# Patient Record
Sex: Female | Born: 1950 | Race: White | Hispanic: No | Marital: Single | Smoking: Former smoker
Health system: Southern US, Community
[De-identification: ages and names within clinical notes are randomized; demographics above are authoritative.]

## PROBLEM LIST (undated history)

## (undated) DIAGNOSIS — I10 Essential (primary) hypertension: Secondary | ICD-10-CM

## (undated) DIAGNOSIS — K219 Gastro-esophageal reflux disease without esophagitis: Secondary | ICD-10-CM

## (undated) DIAGNOSIS — N2581 Secondary hyperparathyroidism of renal origin: Secondary | ICD-10-CM

## (undated) DIAGNOSIS — N183 Chronic kidney disease, stage 3 unspecified: Secondary | ICD-10-CM

## (undated) DIAGNOSIS — I129 Hypertensive chronic kidney disease with stage 1 through stage 4 chronic kidney disease, or unspecified chronic kidney disease: Secondary | ICD-10-CM

## (undated) DIAGNOSIS — C7961 Secondary malignant neoplasm of right ovary: Secondary | ICD-10-CM

## (undated) DIAGNOSIS — C801 Malignant (primary) neoplasm, unspecified: Secondary | ICD-10-CM

## (undated) HISTORY — DX: Secondary malignant neoplasm of right ovary: C79.61

## (undated) HISTORY — DX: Chronic kidney disease, stage 3 unspecified: N18.30

## (undated) HISTORY — DX: Secondary malignant neoplasm of right ovary: C80.1

## (undated) HISTORY — PX: ILEOSTOMY: SHX1783

## (undated) HISTORY — DX: Essential (primary) hypertension: I10

## (undated) HISTORY — DX: Gastro-esophageal reflux disease without esophagitis: K21.9

## (undated) HISTORY — PX: LAPAROSCOPIC LYSIS OF ADHESIONS: SHX5905

## (undated) HISTORY — DX: Hypertensive chronic kidney disease with stage 1 through stage 4 chronic kidney disease, or unspecified chronic kidney disease: I12.9

## (undated) HISTORY — DX: Secondary hyperparathyroidism of renal origin: N25.81

## (undated) HISTORY — DX: Chronic kidney disease, stage 3 (moderate): N18.3

---

## 2012-04-19 ENCOUNTER — Inpatient Hospital Stay: Payer: Self-pay | Admitting: Surgery

## 2012-04-19 LAB — URINALYSIS, COMPLETE
Bilirubin,UR: NEGATIVE
Glucose,UR: NEGATIVE mg/dL (ref 0–75)
Leukocyte Esterase: NEGATIVE
Nitrite: NEGATIVE
Ph: 5 (ref 4.5–8.0)
Protein: 100

## 2012-04-19 LAB — COMPREHENSIVE METABOLIC PANEL
Albumin: 2.5 g/dL — ABNORMAL LOW (ref 3.4–5.0)
Alkaline Phosphatase: 148 U/L — ABNORMAL HIGH (ref 50–136)
Anion Gap: 12 (ref 7–16)
BUN: 19 mg/dL — ABNORMAL HIGH (ref 7–18)
Bilirubin,Total: 0.2 mg/dL (ref 0.2–1.0)
Calcium, Total: 8.6 mg/dL (ref 8.5–10.1)
Co2: 23 mmol/L (ref 21–32)
Creatinine: 1.36 mg/dL — ABNORMAL HIGH (ref 0.60–1.30)
EGFR (African American): 48 — ABNORMAL LOW
EGFR (Non-African Amer.): 42 — ABNORMAL LOW
Glucose: 120 mg/dL — ABNORMAL HIGH (ref 65–99)
SGPT (ALT): 11 U/L — ABNORMAL LOW (ref 12–78)
Sodium: 136 mmol/L (ref 136–145)
Total Protein: 6.8 g/dL (ref 6.4–8.2)

## 2012-04-19 LAB — CBC
HGB: 9.2 g/dL — ABNORMAL LOW (ref 12.0–16.0)
MCH: 27.8 pg (ref 26.0–34.0)
MCV: 85 fL (ref 80–100)
Platelet: 391 10*3/uL (ref 150–440)
RBC: 3.3 10*6/uL — ABNORMAL LOW (ref 3.80–5.20)
RDW: 14 % (ref 11.5–14.5)
WBC: 11.7 10*3/uL — ABNORMAL HIGH (ref 3.6–11.0)

## 2012-04-19 LAB — LIPASE, BLOOD: Lipase: 60 U/L — ABNORMAL LOW (ref 73–393)

## 2012-04-19 LAB — TROPONIN I: Troponin-I: <0.02 ng/mL

## 2012-04-20 LAB — BASIC METABOLIC PANEL
Creatinine: 1.12 mg/dL (ref 0.60–1.30)
EGFR (African American): >60
EGFR (Non-African Amer.): 53 — ABNORMAL LOW
Osmolality: 266 (ref 275–301)
Potassium: 3.9 mmol/L (ref 3.5–5.1)
Sodium: 132 mmol/L — ABNORMAL LOW (ref 136–145)

## 2012-04-21 HISTORY — PX: ABDOMINAL HYSTERECTOMY: SHX81

## 2012-04-21 LAB — CEA: CEA: 2.7 ng/mL (ref 0.0–4.7)

## 2012-04-21 LAB — CA 125: CA 125: 199.8 U/mL — ABNORMAL HIGH (ref 0.0–34.0)

## 2012-05-19 DEATH — deceased

## 2012-10-01 DIAGNOSIS — D649 Anemia, unspecified: Secondary | ICD-10-CM | POA: Insufficient documentation

## 2012-12-21 HISTORY — PX: EXPLORATORY LAPAROTOMY: SUR591

## 2012-12-24 DIAGNOSIS — K56609 Unspecified intestinal obstruction, unspecified as to partial versus complete obstruction: Secondary | ICD-10-CM | POA: Insufficient documentation

## 2014-08-08 NOTE — Consult Note (Signed)
PATIENT NAME:  Rachel Calderon, Rachel Calderon MR#:  [id number] DATE OF BIRTH:  01-Jul-1950  DATE OF CONSULTATION:  04/20/2012  REFERRING PHYSICIAN:  Sherri Rad, MD  CONSULTING PHYSICIAN:  Janalyn Harder. Jerelene Redden, ANP PRIMARY CARE PHYSICIAN: None listed.   REASON FOR CONSULTATION: To consider flexible sigmoidoscopy as part of a workup for a large pelvic mass finding.   HISTORY OF PRESENT ILLNESS: This patient reports that she has had constipation, started abruptly about five weeks ago. She says she has passed no stool in five weeks despite taking over-the-counter Ex-Lax and Dulcolax. She has been passing clear to slightly bloody discharge. She had been eating normally until Tuesday when she developed nausea and vomiting. Her stomach has been increasing in size. She does admit to a 28-pound weight loss over the last nine months to a year. She initially thought this was related to stress. 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 She did have unbearable lower abdominal pain and that was the reason she came into the Emergency Room.  A CT study found a large heterogeneous mass extending superiorly from the pelvis. Difficult to determine the origin of the mass because of its large size and the lack of IV contrast in the study. It appears to involve the uterus and appears inseparable from the sigmoid colon. There was marked associated wall thickening of the sigmoid colon at this site and invasion into or extension from the colon cannot be excluded. Additionally, the mass is inseparable from several loops of small bowel superiorly. A small foci of air along the superior aspect of the mass felt to be secondary to air within the adjacent decompressed loops of small bowel. A fistulous connection within the small bowel would be difficult to exclude. There was also circumferential wall thickening of the distal esophagus which may be secondary to underdistention or esophagitis but is nonspecific.  Malignancy is not excluded. Further evaluation could be provided with endoscopy.   PAST MEDICAL HISTORY: Denied.   PAST SURGICAL HISTORY: C-section x 1.   MEDICATIONS: None.   ALLERGIES: NKDA.  HABITS: Positive tobacco, about 1 pack per week, started at age 67. Negative alcohol or illicit drug use.   SOCIAL HISTORY: The patient says she gave up her baby for adoption. She lives alone. Two sisters present with her. 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 Sister pulls me aside and wants me to note that the patient has a low IQ and that she has difficulty understanding information. Sister reports this patient functions as a 64 year old. She said there is no formal diagnosis of being mentally handicapped, but this knowledge known to the family members.   REVIEW OF SYSTEMS: Positive weight loss, says she had a low-grade fever a couple of days ago, abdominal pain, nausea, vomiting this week. No hematemesis. She has passed some reddish discharge. Constipation was acute without any specific trigger. She denies any headache, vision change, hearing change, difficulty swallowing, odynophagia, heartburn, or reflux. No history of EGD. No prior history of colonoscopy. Says her bowels were normal until about five weeks ago. She denies bone pain, joint pain, edema, skin changes. Remaining 10 systems negative.   PHYSICAL EXAMINATION:  VITAL SIGNS: Temperature 98.4 with 98.7 T-max, pulse of 78, respirations 18, 114/69, pulse ox room air is 92%.  GENERAL: Small frame, thin Caucasian female resting in bed, NAD.  HEENT: Conjunctivae a little injected. Sclerae anicteric. Oral mucosa is dry and intact.  NECK: Supple. Trachea midline. No lymphadenopathy.  CARDIOVASCULAR: Heart tones S1, S2 without murmur, rub, or gallop.  LUNGS: CTA. Respirations are normal. ABDOMEN: Soft, there is distention, and a positive abdominal mass. There is firmness in the lower abdomen. Positive  tenderness left lower quadrant and central lower abdomen.  RECTUM: DRE deferred as it was negative per Dr. Felton Clinton exam.  EXTREMITIES: Lower extremities without edema, cyanosis, or clubbing.  SKIN: Warm and dry without rash. NEUROLOGIC: Cranial nerves grossly intact.  PSYCHIATRIC: Affect and mood a little blunted, she is alert and oriented, interacting, alert and oriented to person, place, and time. A reasonable historian. Speech is clear.   LABORATORY: Admission blood work with glucose 120, BUN 19, creatinine 1.36, sodium 136, potassium 3.1, lipase is 60, albumin 2.5, alkaline phosphatase 148, AST 19, ALT 11. Troponin less than 0.02. WBC 11.7, hemoglobin 9.2, platelet count 391, MCV 85.   Urinalysis: Amber, cloudy, positive ketones, positive for protein, negative nitrite, esterase. Six WBCs per high-powered field, trace bacteria, non-epithelial cells, and positive mucus and amorphous crystal present.   RADIOLOGY: CT scan with mild basilar opacities secondary to atelectasis. Small hiatus hernia. Mild wall thickening distal esophagus at the gastroesophageal junction. Large mass superiorly from the pelvis 12.1 x 8.9 cm in greatest dimension. Mild stranding surrounding the sigmoid and the rectum. This mass was inseparable from several loops of adjacent small bowel. There is a small foci of air along the superior aspect of the mass which are favored to represent air within the compressed loops of the adjacent bowel. However, fistulous connection with the bowel will be difficult to exclude. There is bowel wall thickening of the sigmoid colon in the region that is inseparable from the pelvic mass. Mild stranding surrounding the sigmoid colon and rectum which is nonspecific. Appendix is normal. There are few borderline enlarged retroperitoneal lymph nodes which are nonspecific.   Pelvic ultrasound with transvaginal performed 04/20/2012 shows a large uterine mass with mild right hydronephrosis that could be  secondary to a large pelvic mass.   IMPRESSION:  49. A 64 year old female with large pelvic mass appears uterine in nature. She does report a 28-pound weight loss over the last 9 months, acute constipation started five weeks ago that has progressed to abdominal pain, nausea, vomiting. 2. Right hydroureter noted, elevated BUN and creatinine, low albumin, elevated alkaline phosphatase. Tumor markers are pending.  3. CT scan is abnormal with a large mass extending superiorly from the pelvis, appears to involve the uterus, and is inseparable from the sigmoid colon. There is marked wall thickening of the sigmoid colon and the question is if there is invasion into or extension from a colon.  4. Abnormal distal esophageal  thickening on CT.    PLAN: Dr. Vira Agar discussed this case with Dr. Marina Gravel.  Possible luminal evaluation in the form flexible sigmoidoscopy. Not certain if patient is able to drink oral laxatives for a full bowel preparation for full colonoscopy. There is finding on the CT of distal esophageal thickening which could be due to underdistention or esophagitis, but is nonspecific and malignancy also needs to be ruled out.  Dr. Vira Agar will decide on plans for luminal evaluation.   This case was discussed in collaboration of care with Dr. Vira Agar. Further recommendations pending. Thank you for this consultation.  These services provided by Joelene Millin A. Jerelene Redden, ANP under collaborative agreement with Dr. Vira Agar.    ____________________________ Janalyn Harder. Jerelene Redden, ANP kam:es D: 04/20/2012 12:41:20 ET T: 04/20/2012 13:07:19 ET JOB#: [id number]  cc: Joelene Millin A. Jerelene Redden, ANP, <Dictator> Janalyn Harder. Sherlyn Hay, MSN, ANP-BC Adult Nurse Practitioner ELECTRONICALLY SIGNED 04/20/2012 14:52

## 2014-08-08 NOTE — H&P (Signed)
Subjective/Chief Complaint severe constipation    History of Present Illness 64 y/o female without past medical history presents to ER with about a month of severe constipation along with intermittent blood in stools.  Progressive weight loss and mild lower abdominal pain.  Also with recent nausea and emesis.  Denies Vaginal bleeding.    Past History none    Primary Physician none   Past Med/Surgical Hx:  c-section:   ALLERGIES:  No Known Allergies:   Family and Social History:   Family History Non-Contributory    Social History positive tobacco (Greater than 1 year), negative ETOH, negative Illicit drugs    Place of Living Home   Review of Systems:   Subjective/Chief Complaint see above    Abdominal Pain Yes    Diarrhea No    Nausea/Vomiting Yes   Physical Exam:   GEN cachectic, thin    HEENT pale conjunctivae, PERRL, hearing intact to voice    NECK supple    RESP normal resp effort  clear BS    CARD regular rate  no murmur    ABD positive tenderness  no hernia  soft  adominal Mass  large lower abdominal mass present.    LYMPH negative neck    EXTR negative cyanosis/clubbing    SKIN normal to palpation    NEURO cranial nerves intact    PSYCH A+O to time, place, person    Additional Comments rectal exam shows smooth mass anterior to rectum, no obvious rectal mass on DRE.   Lab Results: Hepatic:  02-Jan-14 12:18    Bilirubin, Total 0.2   Alkaline Phosphatase  148   SGPT (ALT)  11   SGOT (AST) 19   Total Protein, Serum 6.8   Albumin, Serum  2.5  Routine Chem:  02-Jan-14 12:18    Lipase  60 (Result(s) reported on 19 Apr 2012 at 01:16PM.)   Glucose, Serum  120   BUN  19   Creatinine (comp)  1.36   Sodium, Serum 136   Potassium, Serum  3.1   Chloride, Serum 101   CO2, Serum 23   Calcium (Total), Serum 8.6   Osmolality (calc) 275   eGFR (African American)  48   eGFR (Non-African American)  42 (eGFR values <37m/min/1.73 m2 may be an  indication of chronic kidney disease (CKD). Calculated eGFR is useful in patients with stable renal function. The eGFR calculation will not be reliable in acutely ill patients when serum creatinine is changing rapidly. It is not useful in  patients on dialysis. The eGFR calculation may not be applicable to patients at the low and high extremes of body sizes, pregnant women, and vegetarians.)   Anion Gap 12  Cardiac:  02-Jan-14 12:18    Troponin I < 0.02 (0.00-0.05 0.05 ng/mL or less: NEGATIVE  Repeat testing in 3-6 hrs  if clinically indicated. >0.05 ng/mL: POTENTIAL  MYOCARDIAL INJURY. Repeat  testing in 3-6 hrs if  clinically indicated. NOTE: An increase or decrease  of 30% or more on serial  testing suggests a  clinically important change)  Routine UA:  02-Jan-14 12:18    Color (UA) Amber   Clarity (UA) Cloudy   Glucose (UA) Negative   Bilirubin (UA) Negative   Ketones (UA) 1+   Specific Gravity (UA) 1.029   Blood (UA) 2+   pH (UA) 5.0   Protein (UA) 100 mg/dL   Nitrite (UA) Negative   Leukocyte Esterase (UA) Negative (Result(s) reported on 19 Apr 2012 at  01:21PM.)   RBC (UA) 4 /HPF   WBC (UA) 6 /HPF   Bacteria (UA) TRACE   Epithelial Cells (UA) 9 /HPF   Mucous (UA) PRESENT   Amorphous Crystal (UA) PRESENT (Result(s) reported on 19 Apr 2012 at 01:21PM.)  Routine Hem:  02-Jan-14 12:18    WBC (CBC)  11.7   RBC (CBC)  3.30   Hemoglobin (CBC)  9.2   Hematocrit (CBC)  28.0   Platelet Count (CBC) 391 (Result(s) reported on 19 Apr 2012 at 01:08PM.)   MCV 85   MCH 27.8   MCHC 32.7   RDW 14.0   Radiology Results: LabUnknown:    02-Jan-14 15:18, CT Abdomen and Pelvis Without Contrast   PACS Image  CT:   CT Abdomen and Pelvis Without Contrast   REASON FOR EXAM:    (1) abd pain, abdominal mass; (2) abd pain, abdominal   mass  COMMENTS:       PROCEDURE: CT  - CT ABDOMEN AND PELVIS W0  - Apr 19 2012  3:18PM     RESULT:  Comparison: None    Technique: Multiple  axial images from the lung bases to the symphysis   pubis were obtained with oral and without intravenous contrast.    Findings:  Mild basilar opacities are likely secondary to atelectasis. There is a   small hiatal hernia. There is mild wall thickening of the distal   esophagus at the gastroesophageal junction, which is nonspecific.  Lack of intravenous contrast limits evaluation of the solid abdominal   organs.  Grossly, the liver, spleen, adrenals, and pancreas are   unremarkable. Multiple gallstones are present within the gallbladder. The   left kidney is unremarkable. There is moderate right hydronephrosis and   ureterectasis. This is seen to the level of the mass in the pelvis.    There is a very large lobular mass extending superiorly from the pelvis.   It measures 12.1x 8.9 cm in greatest axial dimension. It is difficult to   evaluate secondary to lack of intravenous contrast, but it is inseparable   from the uterus as well as the sigmoid colon. It is also inseparable from   several loops of adjacent small bowel. There are small foci of air along   the superior aspect of the mass which are favored to represent air within   compressed loops of the adjacent bowel. However, a fistulous connection   with the bowel with be difficult to exclude. There is bowel wall   thickening of the sigmoid colon in the region that it is inseparable from     the pelvic mass. There is mild stranding surrounding the sigmoid colon   and rectum, which is nonspecific. This could related to invasion or   infectious or inflammatory changes. The appendix is normal.    No aggressive lytic or sclerotic osseous lesions are identified. Tiny   sclerotic density in the left pubic symphysis likely represents a bone   island.    IMPRESSION:    1. Large heterogeneous mass extending superiorly from the pelvis. It is   difficult to determine the origin of this mass in part related to its   large size and lack of  intravenous contrast. It is favored to originate   from the uterus. It appears to involve the uterus and appears inseparable   from the sigmoid colon. There is marked associated wall thickening of the   sigmoid colon at this site, and invasion into or extension from  the colon     cannot be excluded. Additionally, the mass is inseparable from several   loops of small bowel superiorly. Small foci of air along the superior   aspect of the mass are felt to be secondary to air within the adjacent   decompressed loops of small bowel. A fistulous connection with the small   amount would be difficult to exclude. Surgical consultation is   recommended.  2. There is circumferential wall thickening of the distal esophagus which   may be secondary to underdistention or esophagitis, but is nonspecific.   Malignancy is not excluded. Further evaluation could be provided with   endoscopy.  3. There is moderate right hydronephrosis which is likely related to   obstruction caused by the large pelvic mass.      Addendum:  There are a few borderline enlarged retroperitoneal lymph nodes, which   are nonspecific.        Verified By: Gregor Hams, M.D., MD     Assessment/Admission Diagnosis 64 y/o female with pelvic mass-  appears uterine in nature.  Recent weight lose, contipation. right hydroureter and elevated creatinine concerning. mild anemia    Plan admit, pain control. pelvic US in am CEA, CA 125, oncology consult. plan gyn onc evaluation as outpatient.   Electronic Signatures: Sherri Rad (MD)  (Signed 03-Jan-14 06:09)  Authored: CHIEF COMPLAINT and HISTORY, PAST MEDICAL/SURGIAL HISTORY, ALLERGIES, FAMILY AND SOCIAL HISTORY, REVIEW OF SYSTEMS, PHYSICAL EXAM, LABS, Radiology, ASSESSMENT AND PLAN   Last Updated: 03-Jan-14 06:09 by Sherri Rad (MD)

## 2014-08-08 NOTE — Consult Note (Signed)
Flex sig shows extrinsic complete obstruction of the proximal rectum/rectosigmoid area.  Findings related to Dr. Marina Gravel.  Electronic Signatures: Manya Silvas (MD)  (Signed on 03-Jan-14 16:54)  Authored  Last Updated: 03-Jan-14 16:54 by Manya Silvas (MD)

## 2014-08-08 NOTE — Discharge Summary (Signed)
PATIENT NAME:  Rachel Calderon, Rachel Calderon MR#:  [id number] DATE OF BIRTH:  [birth year]  DATE OF ADMISSION:  05/16/2012 DATE OF DISCHARGE:  04/20/2012  DISCHARGE DIAGNOSES:  1. Pelvic mass. likely uterine in origin. 2. Rectal obstruction.  3. Right hydronephrosis and hydroureter.   PRINCIPAL PROCEDURES:  1. Oral-contrasted abdominopelvic CT scan.  2. Pelvic ultrasound.  3. GI medicine consultation.  4. Flexible sigmoidoscopy.  HOSPITAL COURSE SUMMARY: The patient was admitted on the evening of 05/07/2012. See history and physical for further details. 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 CEA and CA-125 were drawn but have not returned. A pelvic ultrasound was obtained which demonstrated a large mass within the pelvis. The ovaries could not be identified. There was no evidence of ascites seen on CT scan, no obvious omental caking. Images of the liver appear normal, however, are limited by the lack of intravenous contrast. Her creatinine improved somewhat with hydration. The patient continues to have constipation. Despite oral laxatives, she really had no return of stool.  A flexible endoscopy showed rectal obstruction.   The patient's abdomen has been soft upon my most recent examination at the time of this dictation. The patient is nontoxic. After consulting with our local gynecological oncologist, who is not available until next week, I contacted [facility] regarding transfer. They have gladly and graciously accepted the patient in transfer. I discussed this plan with the patient and her family. They understand and wish to proceed with this course of action.   DISCHARGE MEDICATIONS: 1. Acetaminophen tablet 650 mg by mouth every 4 hours as needed.  2. Norco 5/325, 1 to 2 tabs p.o. every 4 hours as needed for pain. 3. Heparin 5000 units subcutaneous q.12 h.  4. Zofran 4 mg IV every 4 hours as needed.  5. Protonix 40 mg by mouth q. a.m.  6. Ambien 10 mg by  mouth as needed for sleep.  7. Morphine 2 to 4 mg every 4 hours as needed for pain.   ____________________________ Jeannette How. Marina Gravel, MD mab:cb D: 04/20/2012 17:43:57 ET T: 04/20/2012 17:52:44 ET JOB#: [id number]  cc: Elta Guadeloupe A. Marina Gravel, MD, <Dictator> Hortencia Conradi MD ELECTRONICALLY SIGNED 04/20/2012 18:30

## 2014-08-08 NOTE — Consult Note (Signed)
See complete consult note from NP.  Pt with abd mass, abnormal CT, possible uterine or colonic large mass, severe constipation, LLQ signif tenderness on exam.  Passing blood and fluid recently.  Plan for Fleets enema and a sigmoidoscopy with anesthesia providing sedation if available.  Pt never had a colonoscopy.  I discussed the higher possible perforation rate due to possibility of tumor eroding into the colon wall.  Electronic Signatures: Manya Silvas (MD)  (Signed on 03-Jan-14 12:46)  Authored  Last Updated: 03-Jan-14 12:46 by Manya Silvas (MD)

## 2014-11-06 ENCOUNTER — Encounter: Payer: Self-pay | Admitting: Family Medicine

## 2015-03-10 ENCOUNTER — Encounter: Payer: Self-pay | Admitting: Family Medicine

## 2015-03-10 ENCOUNTER — Ambulatory Visit (INDEPENDENT_AMBULATORY_CARE_PROVIDER_SITE_OTHER): Payer: Medicare Other | Admitting: Family Medicine

## 2015-03-10 ENCOUNTER — Other Ambulatory Visit: Payer: Self-pay | Admitting: Family Medicine

## 2015-03-10 VITALS — BP 172/96 | HR 116 | Temp 98.7°F | Resp 16 | Ht 62.0 in | Wt 129.8 lb

## 2015-03-10 DIAGNOSIS — IMO0001 Reserved for inherently not codable concepts without codable children: Secondary | ICD-10-CM | POA: Insufficient documentation

## 2015-03-10 DIAGNOSIS — C7960 Secondary malignant neoplasm of unspecified ovary: Secondary | ICD-10-CM

## 2015-03-10 DIAGNOSIS — I10 Essential (primary) hypertension: Secondary | ICD-10-CM | POA: Diagnosis not present

## 2015-03-10 DIAGNOSIS — N183 Chronic kidney disease, stage 3 unspecified: Secondary | ICD-10-CM

## 2015-03-10 DIAGNOSIS — Z8619 Personal history of other infectious and parasitic diseases: Secondary | ICD-10-CM

## 2015-03-10 DIAGNOSIS — N2581 Secondary hyperparathyroidism of renal origin: Secondary | ICD-10-CM

## 2015-03-10 DIAGNOSIS — K219 Gastro-esophageal reflux disease without esophagitis: Secondary | ICD-10-CM

## 2015-03-10 DIAGNOSIS — I129 Hypertensive chronic kidney disease with stage 1 through stage 4 chronic kidney disease, or unspecified chronic kidney disease: Secondary | ICD-10-CM | POA: Diagnosis not present

## 2015-03-10 DIAGNOSIS — R4183 Borderline intellectual functioning: Secondary | ICD-10-CM

## 2015-03-10 MED ORDER — METOPROLOL SUCCINATE ER 25 MG PO TB24: 25.0000 mg | ORAL_TABLET | Freq: Every day | ORAL | Status: DC

## 2015-03-10 NOTE — Progress Notes (Signed)
Name: Rachel Calderon   MRN: [id number]    DOB: [birth year]   Date:03/10/2015       Progress Note  Subjective  Chief Complaint  Chief Complaint  Patient presents with  . Hypertension  . Advice Only    review of CT images from [facility]    HPI  Rachel Calderon is a 64 year old female accompanied by her sister today to discuss her medical needs. She has not followed up with me in nearly a year, last seen when they established care with me 01/22/2014. Her history is significant for ovarian adenocarcinoma, CKD III, HTN, GERD, borderline mental hadicap, history of shingles.   Gyne-Onc is Dr. 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 S/P 6 rounds of Chemo (carboplatin/taxol) in 2014 with post treatment CT July 2014 negative. S/P colostomy reversal.  In regards to her oncology needs she has been working with [facility] closely due to repeat CT scan on 02/23/15 finding:  IMPRESSION:  -- New area of focal intrahepatic biliary dilation versus small branch venous thrombosis in the left hepatic lobe. Although a causative lesion is not identified in the liver on this study, attention on followup is recommended.  -- Slightly smaller porta hepatis lymph node with similar mild stranding/ill-defined soft tissue density in the porta hepatic and peri-pancreatic region. Stranding and fluid inferior to the spleen are slightly increased, which is nonspecific but could reflect slightly worsened carcinomatosis; attention on follow up.  -- Scattered 3-mm right upper lobe pulmonary nodules, overall similar. One nodule is questionably minimally more prominent; attention on follow up.  -- Cholelithiasis, unchanged.   She has started back on chemotherapy, Doxil treatment. After a few sessions she had blistering of hands and skin, placed on steroids, blisters resolved. However her HTN is out of control.  Ranging systolic 99991111 and diastolic [id number]. She denies chest pain, nausea, vomiting, headaches, dizziness, syncope, paresthesia.   Sister reports to me they have not followed up with Nephrologist regarding CKD III.    Past Medical History  Diagnosis Date  . Metastatic adenocarcinoma involving right ovary with unknown primary site [facility])   . Hypertension, goal below 150/90   . Hypertensive kidney disease with CKD stage III   . GERD without esophagitis   . Secondary hyperparathyroidism, renal [facility])     Patient Active Problem List   Diagnosis Date Noted  . Hypertension goal BP (blood pressure) < 140/90 03/10/2015  . GERD without esophagitis 03/10/2015  . Metastatic adenocarcinoma of ovary ([place]) 03/10/2015  . Borderline mental handicap 03/10/2015  . History of shingles 03/10/2015  . Hypertensive kidney disease with CKD stage III 03/10/2015  . Secondary hyperparathyroidism of renal origin ([place]) 03/10/2015  . Small bowel obstruction ([place]) 12/24/2012  . Absolute anemia 10/01/2012  . Encounter for antineoplastic chemotherapy 06/13/2012    Social History  Substance Use Topics  . Smoking status: Former Research scientist (life sciences)  . Smokeless tobacco: Not on file  . Alcohol Use: No     Current outpatient prescriptions:  .  CARAFATE 1 GM/10ML suspension, Take 5 mLs by mouth 2 (two) times daily., Disp: , Rfl: 0 .  dexamethasone (DECADRON) 4 MG tablet, Take 2 tablets by mouth on days 2, 3, and 4 after chemotherapy., Disp: , Rfl:  .  omeprazole (PRILOSEC) 20 MG capsule, Take 20 mg by mouth 2 (two) times daily., Disp: , Rfl: 0 .  loratadine (CLARITIN) 10 MG tablet, Take  10 mg by mouth daily., Disp: , Rfl:  .  metoprolol succinate (TOPROL-XL) 25 MG 24 hr tablet, Take 1 tablet (25 mg total) by mouth daily., Disp: 30 tablet, Rfl: 1 .  polyethylene glycol (MIRALAX / GLYCOLAX) packet, One packet daily, Disp: , Rfl:  .  prochlorperazine (COMPAZINE) 10 MG tablet, Take 10 mg by mouth., Disp: , Rfl:   Past  Surgical History  Procedure Laterality Date  . Exploratory laparotomy  12/21/2012    with celiotomy  . Ileostomy      reversal  . Laparoscopic lysis of adhesions      adnexal adhesions  . Abdominal hysterectomy  04/21/2012    total  . Cesarean section      Family History  Problem Relation Age of Onset  . Heart disease Mother   . COPD Mother   . Heart disease Father     No Known Allergies   Review of Systems  CONSTITUTIONAL: No significant weight changes, fever, chills. Stable weakness and fatigue.  HEENT:  - Eyes: No visual changes.  - Ears: No auditory changes. No pain.  - Nose: No sneezing, congestion, runny nose. - Throat: No sore throat. No changes in swallowing. SKIN: No rash or itching.  CARDIOVASCULAR: No chest pain, chest pressure or chest discomfort. No palpitations or edema.  RESPIRATORY: No shortness of breath, cough or sputum.  NEUROLOGICAL: No headache, dizziness, syncope, paralysis, ataxia, numbness or tingling in the extremities. No memory changes. No change in bowel or bladder control.  MUSCULOSKELETAL: No joint pain. No muscle pain. HEMATOLOGIC: No anemia, bleeding or bruising.  LYMPHATICS: No enlarged lymph nodes.  PSYCHIATRIC: No change in mood. No change in sleep pattern.  ENDOCRINOLOGIC: No reports of sweating, cold or heat intolerance. No polyuria or polydipsia.     Objective  BP 172/96 mmHg  Pulse 116  Temp(Src) 98.7 F (37.1 C) (Oral)  Resp 16  Ht 5\' 2"  (1.575 m)  Wt 129 lb 12.8 oz (58.877 kg)  BMI 23.73 kg/m2  SpO2 99% Body mass index is 23.73 kg/(m^2).  Physical Exam  Constitutional: Patient appears nourished, notable hair loss. In no distress. Cheeks swollen, soft swelling over left clavicle (non pulsatile). HEENT:  - Head: Normocephalic and atraumatic.  - Ears: Bilateral TMs gray, no erythema or effusion - Nose: Nasal mucosa moist - Mouth/Throat: Oropharynx is clear and moist. No tonsillar hypertrophy or erythema. No post nasal  drainage.  - Eyes: Conjunctivae clear, EOM movements normal. PERRLA. No scleral icterus.  Neck: Normal range of motion. Neck supple. No JVD present. No thyromegaly present.  Cardiovascular: Normal rate, regular rhythm and normal heart sounds.  No murmur heard.  Pulmonary/Chest: Effort normal and breath sounds normal. No respiratory distress. Musculoskeletal: Normal range of motion bilateral UE and LE, no joint effusions. Peripheral vascular: Bilateral LE no edema. Neurological: CN II-XII grossly intact with no focal deficits. Alert and oriented to person, place, and time. Coordination, balance, strength, speech and gait are normal.  Skin: Skin is warm and dry. No rash noted. No erythema.  Psychiatric: Patient has a stable mood and affect. Behavior is normal in office today.  Assessment & Plan  1. Hypertension goal BP (blood pressure) < 140/90 JNC-8 vs ADA treatment goals discussed with patient. Current blood pressure not well controled. Remains clinically asymptomatic. Plan to initiate BB as ACEi/ARB/Diuretic might  worsen CKD stage III. The patient has been counseled on the proper use, side effects and potential interactions of the new medication. Patient encouraged to review the  side effects and safety profile pamphlet provided with the prescription from the pharmacy as well as request counseling from the pharmacy team as needed.    - metoprolol succinate (TOPROL-XL) 25 MG 24 hr tablet; Take 1 tablet (25 mg total) by mouth daily.  Dispense: 30 tablet; Refill: 1  2. Metastatic adenocarcinoma of ovary, unspecified laterality [facility]) Reviewed available documentation and testing though Care Everywhere.  3. Hypertensive kidney disease with CKD stage III Staff will reach out to Nephrologist and secure follow up appointment for her.

## 2015-03-11 ENCOUNTER — Telehealth: Payer: Self-pay

## 2015-03-11 NOTE — Telephone Encounter (Signed)
Called patient and notified her we have scheduled her an appointment at [facility] on Tuesday 03-17-15 at 11:00am with Dr. Juleen China. Pt appreciates all of our help.

## 2015-03-16 ENCOUNTER — Ambulatory Visit: Payer: Self-pay | Admitting: Family Medicine

## 2015-03-17 ENCOUNTER — Telehealth: Payer: Self-pay | Admitting: Family Medicine

## 2015-03-17 NOTE — Telephone Encounter (Signed)
PT SISTER ELLEN IS ASKING IF YOU COULD CALL HER AS SOON AS POSSIBLE. SHE HAS SOME CONCERNS ABOUT HER SISTER. SHE SAID THAT THE DR AND HER TALKED ABOUT THIS THE LAST TIME SHE WAS IN. IT IS ABOUT WHAT THE DOCTORS SAY TO HERE UPON VISITS. SHE FEELS LIKE SHE IS NOT GETTING INFORMATION TO THE PCP DR AND OTHERS.

## 2015-03-17 NOTE — Telephone Encounter (Signed)
Dorian Pod wants Dr. Nadine Counts to contact Dr. Assunta Gambles office to find out how the appt went today. She stated that when she asked her sister about the appt, she said everything was fine. I told her that I will contact Dr. Assunta Gambles office to see if they can fax a copy of their progress note over to our office so that Dr. Nadine Counts could review it. She said ok and thanks.  Contacted [facility] at [phone] and had them to fax [place] a copy to our office.

## 2015-03-17 NOTE — Telephone Encounter (Signed)
Having Latisha call Dorian Pod right now to clarify things.

## 2015-03-31 ENCOUNTER — Encounter: Payer: Self-pay | Admitting: Family Medicine

## 2015-03-31 ENCOUNTER — Ambulatory Visit (INDEPENDENT_AMBULATORY_CARE_PROVIDER_SITE_OTHER): Payer: Medicare Other | Admitting: Family Medicine

## 2015-03-31 VITALS — BP 122/84 | HR 95 | Temp 98.1°F | Resp 16 | Wt 131.4 lb

## 2015-03-31 DIAGNOSIS — I10 Essential (primary) hypertension: Secondary | ICD-10-CM

## 2015-03-31 MED ORDER — METOPROLOL SUCCINATE ER 25 MG PO TB24: 25.0000 mg | ORAL_TABLET | Freq: Every day | ORAL | Status: AC

## 2015-03-31 NOTE — Progress Notes (Signed)
Name: Rachel Calderon   MRN: [id number]    DOB: 1950-06-02   Date:03/31/2015       Progress Note  Subjective  Chief Complaint  Chief Complaint  Patient presents with  . Follow-up    HPI  Rachel Calderon is a 64 year old female accompanied by her sister today for HTN follow up. Her history is significant for ovarian adenocarcinoma, CKD III, HTN, GERD, borderline mental hadicap, history of shingles.   She has started back on chemotherapy, Doxil treatment. After a few sessions she had blistering of hands and skin, placed on steroids, blisters resolved. However her HTN is out of control. At our last visit she was started on Toprol XL 25 mg one a day. She denies chest pain, nausea, vomiting, headaches, dizziness, syncope, paresthesia.   She has since followed up with Nephrology specialist on 03/17/15, blood pressure was improved at that office visit, 143/96. HCTZ and Lisinopril discontinued previously due to dry cough. Plan to start Losartan only if BP not at goal after chemotherapy completed as baseline stable creatine needed.   Past Medical History  Diagnosis Date  . Metastatic adenocarcinoma involving right ovary with unknown primary site [facility])   . Hypertension, goal below 150/90   . Hypertensive kidney disease with CKD stage III   . GERD without esophagitis   . Secondary hyperparathyroidism, renal [facility])     Patient Active Problem List   Diagnosis Date Noted  . Hypertension goal BP (blood pressure) < 140/90 03/10/2015  . GERD without esophagitis 03/10/2015  . Metastatic adenocarcinoma of ovary ([place]) 03/10/2015  . Borderline mental handicap 03/10/2015  . History of shingles 03/10/2015  . Hypertensive kidney disease with CKD stage III 03/10/2015  . Secondary hyperparathyroidism of renal origin ([place]) 03/10/2015  . Small bowel obstruction ([place]) 12/24/2012  . Absolute anemia 10/01/2012  . Encounter for antineoplastic chemotherapy 06/13/2012    Social History   Substance Use Topics  . Smoking status: Former Research scientist (life sciences)  . Smokeless tobacco: Not on file  . Alcohol Use: No     Current outpatient prescriptions:  .  CARAFATE 1 GM/10ML suspension, Take 5 mLs by mouth 2 (two) times daily., Disp: , Rfl: 0 .  dexamethasone (DECADRON) 4 MG tablet, Take 2 tablets by mouth on days 2, 3, and 4 after chemotherapy., Disp: , Rfl:  .  loratadine (CLARITIN) 10 MG tablet, Take 10 mg by mouth daily., Disp: , Rfl:  .  metoprolol succinate (TOPROL-XL) 25 MG 24 hr tablet, Take 1 tablet (25 mg total) by mouth daily., Disp: 90 tablet, Rfl: 2 .  omeprazole (PRILOSEC) 20 MG capsule, Take 20 mg by mouth 2 (two) times daily., Disp: , Rfl: 0 .  polyethylene glycol (MIRALAX / GLYCOLAX) packet, One packet daily, Disp: , Rfl:  .  prochlorperazine (COMPAZINE) 10 MG tablet, Take 10 mg by mouth., Disp: , Rfl:   Past Surgical History  Procedure Laterality Date  . Exploratory laparotomy  12/21/2012    with celiotomy  . Ileostomy      reversal  . Laparoscopic lysis of adhesions      adnexal adhesions  . Abdominal hysterectomy  04/21/2012    total  . Cesarean section      Family History  Problem Relation Age of Onset  . Heart disease Mother   . COPD Mother   . Heart disease Father     No Known Allergies   Review of Systems  CONSTITUTIONAL: No significant weight changes, fever, chills, weakness or fatigue.  CARDIOVASCULAR: No chest pain, chest pressure or chest discomfort. No palpitations or edema.  RESPIRATORY: No shortness of breath, cough or sputum.  NEUROLOGICAL: No headache, dizziness, syncope, paralysis, ataxia, numbness or tingling in the extremities. No memory changes. No change in bowel or bladder control.  MUSCULOSKELETAL: No joint pain. No muscle pain. PSYCHIATRIC: No change in mood. No change in sleep pattern.  ENDOCRINOLOGIC: No reports of sweating, cold or heat intolerance. No polyuria or polydipsia.     Objective  BP 122/84 mmHg  Pulse 95   Temp(Src) 98.1 F (36.7 C) (Oral)  Resp 16  Wt 131 lb 6.4 oz (59.603 kg)  SpO2 96% Body mass index is 24.03 kg/(m^2).  Physical Exam  Constitutional: Patient appears nourished, notable hair loss. In no distress.  HEENT:  - Head: Normocephalic and atraumatic.  - Ears: Bilateral TMs gray, no erythema or effusion - Nose: Nasal mucosa moist - Mouth/Throat: Oropharynx is clear and moist. No tonsillar hypertrophy or erythema. No post nasal drainage.  - Eyes: Conjunctivae clear, EOM movements normal. PERRLA. No scleral icterus.  Neck: Normal range of motion. Neck supple. No JVD present. No thyromegaly present.  Cardiovascular: Normal rate, regular rhythm and normal heart sounds. No murmur heard.  Pulmonary/Chest: Effort normal and breath sounds normal. No respiratory distress.  Psychiatric: Patient has a stable mood and affect. Behavior is normal in office today.  Assessment & Plan  1. Hypertension goal BP (blood pressure) < 140/90 Well controled today. Continue current regimen.   - metoprolol succinate (TOPROL-XL) 25 MG 24 hr tablet; Take 1 tablet (25 mg total) by mouth daily.  Dispense: 90 tablet; Refill: 2

## 2015-03-31 NOTE — Patient Instructions (Signed)
Blood pressure is much improved, continue Metoprolol Succinate 25 mg one a day, 90 day supply with refills sent to pharmacy.   Follow up with Nephrologist after finishing Chemotherapy.

## 2015-12-15 ENCOUNTER — Emergency Department: Payer: Medicare Other

## 2015-12-15 ENCOUNTER — Encounter: Payer: Self-pay | Admitting: Emergency Medicine

## 2015-12-15 ENCOUNTER — Emergency Department
Admission: EM | Admit: 2015-12-15 | Discharge: 2015-12-15 | Disposition: A | Discharge status: Other Acute Inpatient Hospital | Payer: Medicare Other | Attending: Emergency Medicine | Admitting: Emergency Medicine

## 2015-12-15 DIAGNOSIS — Z87891 Personal history of nicotine dependence: Secondary | ICD-10-CM | POA: Insufficient documentation

## 2015-12-15 DIAGNOSIS — C7931 Secondary malignant neoplasm of brain: Secondary | ICD-10-CM | POA: Diagnosis not present

## 2015-12-15 DIAGNOSIS — I129 Hypertensive chronic kidney disease with stage 1 through stage 4 chronic kidney disease, or unspecified chronic kidney disease: Secondary | ICD-10-CM | POA: Diagnosis not present

## 2015-12-15 DIAGNOSIS — N183 Chronic kidney disease, stage 3 (moderate): Secondary | ICD-10-CM | POA: Diagnosis not present

## 2015-12-15 DIAGNOSIS — Z79899 Other long term (current) drug therapy: Secondary | ICD-10-CM | POA: Insufficient documentation

## 2015-12-15 DIAGNOSIS — R4182 Altered mental status, unspecified: Secondary | ICD-10-CM

## 2015-12-15 DIAGNOSIS — R109 Unspecified abdominal pain: Secondary | ICD-10-CM | POA: Diagnosis present

## 2015-12-15 LAB — CBC WITH DIFFERENTIAL/PLATELET
BASOS ABS: 0 10*3/uL (ref 0–0.1)
BASOS PCT: 0 %
EOS ABS: 0 10*3/uL (ref 0–0.7)
Eosinophils Relative: 0 %
HEMATOCRIT: 34.2 % — AB (ref 35.0–47.0)
HEMOGLOBIN: 11.9 g/dL — AB (ref 12.0–16.0)
Lymphocytes Relative: 7 %
Lymphs Abs: 0.5 10*3/uL — ABNORMAL LOW (ref 1.0–3.6)
MCH: 32.2 pg (ref 26.0–34.0)
MCHC: 34.7 g/dL (ref 32.0–36.0)
MCV: 92.9 fL (ref 80.0–100.0)
MONOS PCT: 5 %
Monocytes Absolute: 0.4 10*3/uL (ref 0.2–0.9)
NEUTROS ABS: 6.3 10*3/uL (ref 1.4–6.5)
Neutrophils Relative %: 88 %
Platelets: 172 10*3/uL (ref 150–440)
RBC: 3.68 MIL/uL — AB (ref 3.80–5.20)
RDW: 17 % — ABNORMAL HIGH (ref 11.5–14.5)
WBC: 7.2 10*3/uL (ref 3.6–11.0)

## 2015-12-15 LAB — URINALYSIS COMPLETE WITH MICROSCOPIC (ARMC ONLY)
BACTERIA UA: NONE SEEN
Bilirubin Urine: NEGATIVE
GLUCOSE, UA: NEGATIVE mg/dL
LEUKOCYTES UA: NEGATIVE
NITRITE: NEGATIVE
Protein, ur: 100 mg/dL — AB
SPECIFIC GRAVITY, URINE: 1.017 (ref 1.005–1.030)
pH: 5 (ref 5.0–8.0)

## 2015-12-15 LAB — COMPREHENSIVE METABOLIC PANEL
ALK PHOS: 313 U/L — AB (ref 38–126)
ALT: 47 U/L (ref 14–54)
ANION GAP: 13 (ref 5–15)
AST: 59 U/L — ABNORMAL HIGH (ref 15–41)
Albumin: 3.8 g/dL (ref 3.5–5.0)
BUN: 16 mg/dL (ref 6–20)
CALCIUM: 9.5 mg/dL (ref 8.9–10.3)
CO2: 22 mmol/L (ref 22–32)
CREATININE: 1.39 mg/dL — AB (ref 0.44–1.00)
Chloride: 100 mmol/L — ABNORMAL LOW (ref 101–111)
GFR, EST AFRICAN AMERICAN: 45 mL/min — AB (ref >60)
GFR, EST NON AFRICAN AMERICAN: 39 mL/min — AB (ref >60)
Glucose, Bld: 123 mg/dL — ABNORMAL HIGH (ref 65–99)
Potassium: 2.8 mmol/L — ABNORMAL LOW (ref 3.5–5.1)
SODIUM: 135 mmol/L (ref 135–145)
TOTAL PROTEIN: 6.8 g/dL (ref 6.5–8.1)
Total Bilirubin: 1.3 mg/dL — ABNORMAL HIGH (ref 0.3–1.2)

## 2015-12-15 MED ORDER — ONDANSETRON HCL 4 MG/2ML IJ SOLN
INTRAMUSCULAR | Status: AC
  Administered 2015-12-15: 4 mg via INTRAVENOUS
  Filled 2015-12-15: qty 2

## 2015-12-15 MED ORDER — IOPAMIDOL (ISOVUE-300) INJECTION 61%
80.0000 mL | Freq: Once | INTRAVENOUS | Status: AC | PRN
  Administered 2015-12-15: 80 mL via INTRAVENOUS

## 2015-12-15 MED ORDER — ONDANSETRON HCL 4 MG/2ML IJ SOLN
4.0000 mg | Freq: Once | INTRAMUSCULAR | Status: AC
  Administered 2015-12-15: 4 mg via INTRAVENOUS

## 2015-12-15 MED ORDER — DEXAMETHASONE SODIUM PHOSPHATE 10 MG/ML IJ SOLN
10.0000 mg | Freq: Once | INTRAMUSCULAR | Status: AC
  Administered 2015-12-15: 10 mg via INTRAVENOUS
  Filled 2015-12-15: qty 1

## 2015-12-15 MED ORDER — POTASSIUM CHLORIDE 10 MEQ/50ML IV SOLN
10.0000 meq | INTRAVENOUS | Status: DC
  Administered 2015-12-15 (×2): 10 meq via INTRAVENOUS
  Filled 2015-12-15 (×4): qty 50

## 2015-12-15 MED ORDER — METOPROLOL TARTRATE 5 MG/5ML IV SOLN
5.0000 mg | Freq: Once | INTRAVENOUS | Status: AC
  Administered 2015-12-15: 5 mg via INTRAVENOUS
  Filled 2015-12-15: qty 5

## 2015-12-15 MED ORDER — FENTANYL CITRATE (PF) 100 MCG/2ML IJ SOLN
50.0000 ug | Freq: Once | INTRAMUSCULAR | Status: AC
Start: 2015-12-15 — End: 2015-12-15
  Administered 2015-12-15: 50 ug via INTRAVENOUS
  Filled 2015-12-15: qty 2

## 2015-12-15 MED ORDER — POTASSIUM CHLORIDE CRYS ER 20 MEQ PO TBCR
40.0000 meq | EXTENDED_RELEASE_TABLET | Freq: Once | ORAL | Status: DC
  Filled 2015-12-15: qty 2

## 2015-12-15 MED ORDER — SODIUM CHLORIDE 0.9 % IV BOLUS (SEPSIS)
500.0000 mL | Freq: Once | INTRAVENOUS | Status: AC
  Administered 2015-12-15: 500 mL via INTRAVENOUS

## 2015-12-15 MED ORDER — DIATRIZOATE MEGLUMINE & SODIUM 66-10 % PO SOLN: 15.0000 mL | Freq: Once | ORAL | Status: DC

## 2015-12-15 NOTE — ED Notes (Signed)
MD at bedside. 

## 2015-12-15 NOTE — ED Notes (Signed)
Report given to [facility] transport. Pt transferred onto [facility] stretcher without incidence. Vital signs stable prior to transport.

## 2015-12-15 NOTE — ED Triage Notes (Signed)
Pt woke up with right flank pain today. Unclear CA hx. Sister reports pt is on "maintenance chemo".  Sister reports pt has been confused today. Pt disoriented to time.

## 2015-12-15 NOTE — ED Notes (Signed)
Bruising to posterior left arm. Unsure if pt fell.  Pt does not remember falling but has been confused today per sister. Slight confusion yesterday as well.

## 2015-12-15 NOTE — ED Provider Notes (Signed)
[facility] Emergency Department Provider Note  ____________________________________________   None    (approximate)  I have reviewed the triage vital signs and the nursing notes.   HISTORY  Chief Complaint Back Pain HPI Rachel Calderon is a 65 y.o. 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 She has also been infused and has fallen several times. She lives by herself and normally is able to care for herself so this is unusual for her. The family denies any vomiting. No known recent fevers or illness. She did have a recent biopsy of pulmonary nodules and her oncologist, Dr. Donna Christen at [facility] left message for the patient but the sister is unsure what it was about. Upon review of the records the message was to go over the patient's Foundation 1 testing which did not reveal any targetable mutations and therefore patient was at advised to continue on standard chemotherapy.  Past Medical History:  Diagnosis Date  . GERD without esophagitis   . Hypertension, goal below 150/90   . Hypertensive kidney disease with CKD stage III   . 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 Secondary hyperparathyroidism, renal [facility])     Patient Active Problem List   Diagnosis Date Noted  . Hypertension goal BP (blood pressure) < 140/90 03/10/2015  . GERD without esophagitis 03/10/2015  . Metastatic adenocarcinoma of ovary ([place]) 03/10/2015  . Borderline mental handicap 03/10/2015  . History of shingles 03/10/2015  . Hypertensive kidney disease with CKD stage III 03/10/2015  . Secondary hyperparathyroidism of renal origin ([place]) 03/10/2015  . Small bowel obstruction ([place]) 12/24/2012  . Absolute anemia 10/01/2012  . Encounter for antineoplastic chemotherapy 06/13/2012    Past Surgical  History:  Procedure Laterality Date  . ABDOMINAL HYSTERECTOMY  04/21/2012   total  . CESAREAN SECTION    . EXPLORATORY LAPAROTOMY  12/21/2012   with celiotomy  . ILEOSTOMY     reversal  . LAPAROSCOPIC LYSIS OF ADHESIONS     adnexal adhesions    Prior to Admission medications   Medication Sig Start Date End Date Taking? Authorizing Provider  CARAFATE 1 GM/10ML suspension Take 5 mLs by mouth 2 (two) times daily. 01/22/15   Historical Provider, MD  loratadine (CLARITIN) 10 MG tablet Take 10 mg by mouth daily. 06/24/14 06/24/15  Historical Provider, MD  metoprolol succinate (TOPROL-XL) 25 MG 24 hr tablet Take 1 tablet (25 mg total) by mouth daily. 03/31/15   Bobetta Lime, MD  omeprazole (PRILOSEC) 20 MG capsule Take 20 mg by mouth 2 (two) times daily. 03/05/15   Historical Provider, MD  polyethylene glycol (MIRALAX / GLYCOLAX) packet One packet daily 05/21/14   Historical Provider, MD  prochlorperazine (COMPAZINE) 10 MG tablet Take 10 mg by mouth. 12/03/14   Historical Provider, MD    Allergies Review of patient's allergies indicates no known allergies.  Family History  Problem Relation Age of Onset  . Heart disease Mother   . COPD Mother   . Heart disease Father     Social History Social History  Substance Use Topics  . Smoking status: Former Research scientist (life sciences)  . Smokeless tobacco: Not on file  . Alcohol use No    Review of Systems Constitutional: No fever/chills Eyes: No visual changes. ENT: No sore throat. Cardiovascular: Denies chest pain. Respiratory: Denies shortness of breath. Gastrointestinal: No  abdominal pain. May be constipated Genitourinary: Negative for dysuria. Musculoskeletal: R flank/lower back pain Skin: Negative for rash. Neurological: Negative for headaches 10-point ROS otherwise negative.  ____________________________________________   PHYSICAL EXAM: Vitals:   12/15/15 1500 12/15/15 1530  BP: (!) 147/95 (!) 172/126  Pulse: 90 (!) 111  Resp: 20 (!) 25    Temp:     Constitutional: Alert and oriented; appears in pain.  Eyes: Conjunctivae are normal. PERRL. EOMI. Head: Atraumatic. Nose: No congestion/rhinnorhea. Mouth/Throat: Mucous membranes are moist.  Oropharynx non-erythematous. Neck: No stridor. Hematological/Lymphatic/Immunilogical: No cervical lymphadenopathy. Cardiovascular: Normal rate, regular rhythm. Grossly normal heart sounds.  Good peripheral circulation. Respiratory: Normal respiratory effort.  No retractions. Lungs CTAB. Gastrointestinal: Soft and nontender. No distention. Pain over R flank & R posterior chest wall Musculoskeletal: No lower extremity tenderness nor edema.  No joint effusions. Neurologic:  Normal speech and language. No gross focal neurologic deficits are appreciated. Skin:  Skin is warm, dry and intact. No rash noted. Psychiatric: Mood and affect are normal. Speech and behavior are normal.  ____________________________________________   LABS (all labs ordered are listed, but only abnormal results are displayed)  Labs Reviewed  URINALYSIS COMPLETEWITH MICROSCOPIC ([facility] ONLY) - Abnormal; Notable for the following:       Result Value   Color, Urine YELLOW (*)    APPearance CLEAR (*)    Ketones, ur 2+ (*)    Hgb urine dipstick 2+ (*)    Protein, ur 100 (*)    Squamous Epithelial / LPF 0-5 (*)    All other components within normal limits  COMPREHENSIVE METABOLIC PANEL - Abnormal; Notable for the following:    Potassium 2.8 (*)    Chloride 100 (*)    Glucose, Bld 123 (*)    Creatinine, Ser 1.39 (*)    AST 59 (*)    Alkaline Phosphatase 313 (*)    Total Bilirubin 1.3 (*)    GFR calc non Af Amer 39 (*)    GFR calc Af Amer 45 (*)    All other components within normal limits  CBC WITH DIFFERENTIAL/PLATELET - Abnormal; Notable for the following:    RBC 3.68 (*)    Hemoglobin 11.9 (*)    HCT 34.2 (*)    RDW 17.0 (*)    Lymphs Abs 0.5 (*)    All other components within normal limits    ____________________________________________  EKG  ED ECG REPORT I, Antonella Upson,  Jakeria Caissie, the attending physician, personally viewed and interpreted this ECG.   Date: 12/15/15 1253  Rate: 89  Rhythm: nsr  Axis: nl  Intervals:nl ST&T Change:nl ____________________________________________  RADIOLOGY  Ct head:1. Multiple enhancing masses in the brain (at least 7) consistent with metastatic disease. Associated vasogenic edema and local mass effect is present. Increased density of masses may represent high cellularity, mineralization, or petechial hemorrhage. 2. Mass effect associated with large vermian lesion partially effaces fourth ventricle. No hydrocephalus at this time.  CT c/a/p:1. Scattered bilateral pulmonary nodules suspicious for metastatic disease. 2. 65% subacute compression fracture of T12 with 8 mm posterior bony retropulsion leading to at least mild central narrowing of the thecal sac 3. Abnormal new intrahepatic biliary dilatation in the lateral segment left hepatic lobe and to a lesser extent in the medial segment, with truncation of portal venous branches in the left hepatic lobe. I am suspicious for potential tumor in the liver although any obstructing tumor is very ill-defined would probably be better characterized by a hepatic protocol MRI. There is also  a indistinct soft tissue density along the porta hepatis tracking along the hepatic artery and extrahepatic biliary tree, and infiltrating tumor in this vicinity is not excluded. Multiple gallstones are present in the gallbladder. 4. 1.3 by 1.6 cm nodule in the left adrenal gland, not well seen previously and possibly metastatic. 5. Mild superior endplate compressions at L3 and L4, age indeterminate. Central disc protrusion at L1- 2 extending cephalad. 6. **An incidental finding of potential clinical significance has been found. 4.2 cm in diameter ascending thoracic aortic aneurysm. Recommend annual  imaging followup by CTA or MRA. This recommendation follows 2010 ACCF/AHA/AATS/ACR/ASA/SCA/SCAI/SIR/STS/SVM Guidelines for the Diagnosis and Management of Patients with Thoracic Aortic Disease. Circulation. 2010; 121: [id number]** 7. Other imaging findings of potential clinical significance: Coronary artery atherosclerotic calcification. Moderate-sized hiatal hernia. Centrilobular emphysema. Multiple bowel anastomoses. Aortoiliac atherosclerotic vascular disease.   ____________________________________________   Procedures  ____________________________________________   INITIAL IMPRESSION / ASSESSMENT AND PLAN / ED COURSE  Pertinent labs & imaging results that were available during my care of the patient were reviewed by me and considered in my medical decision making (see chart for details).   Clinical Course   5:30p: Presumed brain mets seen on CT by technologist; contrast added & I am giving decadron.  I d/w pt's sister, Dorian Pod [phone]; she would prefer her sister be transferred to [facility] since her primary gyn/onc is there.  6p: I d/w Dr. Cleopatra Cedar Gyn/Onc.  Agrees with decadron.  Knows pt well & agrees transfer is appropriate.   7p:  Vitals:   12/15/15 1830 12/15/15 1855  BP: (!) 157/102 (!) 169/115  Pulse: 94 94  Resp: 20 18  Temp:  98.8 F (37.1 C)  Pt has not been taking her antihypertensives; will give metoprolol.    ____________________________________________   FINAL CLINICAL IMPRESSION(S) / ED DIAGNOSES  Final diagnoses:  Altered mental status  Brain metastasis ([place])  Multiple new brain mets with vasogenic edema.   Subacute T12 compression fx  NEW MEDICATIONS STARTED DURING THIS VISIT:  New Prescriptions   No medications on file     Note:  This document was prepared using Dragon voice recognition software and may include unintentional dictation errors.    Ponciano Ort, MD 12/15/15 1910

## 2015-12-15 NOTE — ED Notes (Signed)
Pt having NV. MD notified

## 2015-12-15 NOTE — ED Notes (Signed)
Report called to catherine RN at [place] with [facility] transport. All questions answered.

## 2015-12-15 NOTE — ED Notes (Signed)
Pt currently in CT scanner.

## 2015-12-15 NOTE — ED Notes (Signed)
Patient transported to X-ray 

## 2016-03-18 DEATH — deceased

## 2017-01-22 IMAGING — CT CT ABD-PELV W/ CM
2 of 5 series · 11 of 36 positions shown, 13 images · IV contrast (iopamidol)
Comparison: Multiple exams, including 12/15/2015

CLINICAL DATA: History of metastatic ovarian cancer. Secondary
hyperparathyroidism of renal origin. Maintenance chemotherapy.
Confusion and disorientation. Patient awoke with right flank pain.

EXAM:
CT CHEST, ABDOMEN, AND PELVIS WITH CONTRAST
TECHNIQUE: Multidetector CT imaging of the chest, abdomen and pelvis was
performed following the standard protocol during bolus
administration of intravenous contrast.
CONTRAST:  80mL [id number] IOPAMIDOL ([id number]) INJECTION 61%

[Series 4: cap with · axial · 0.64mm/px · z∈[-209,+256]mm · 8 of 115 slices shown, 10 images]
[im 11/115  mediastinal]
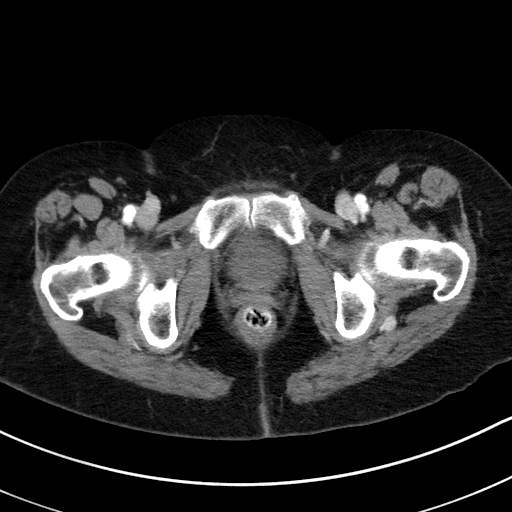
[im 11/115  lung]
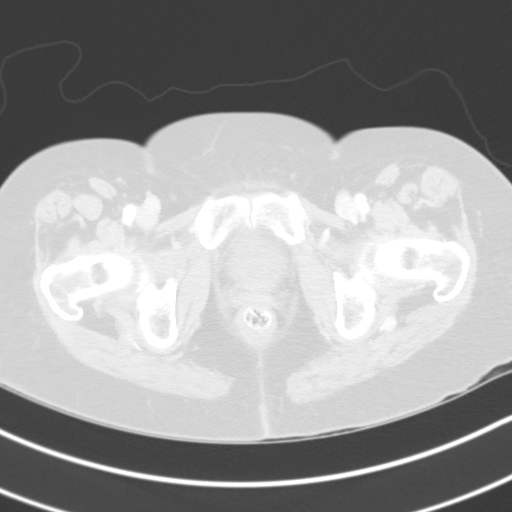
[im 21/115  lung]
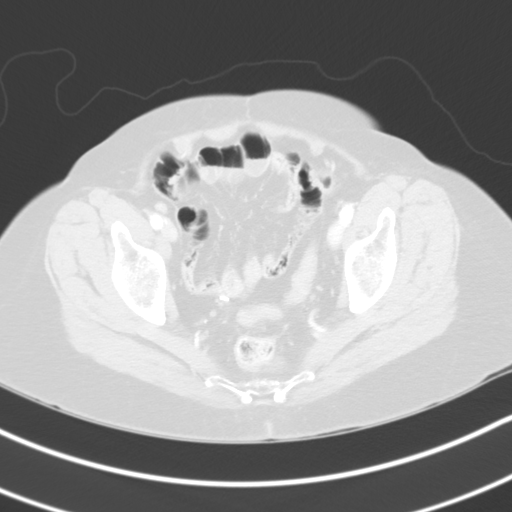
[im 42/115  lung]
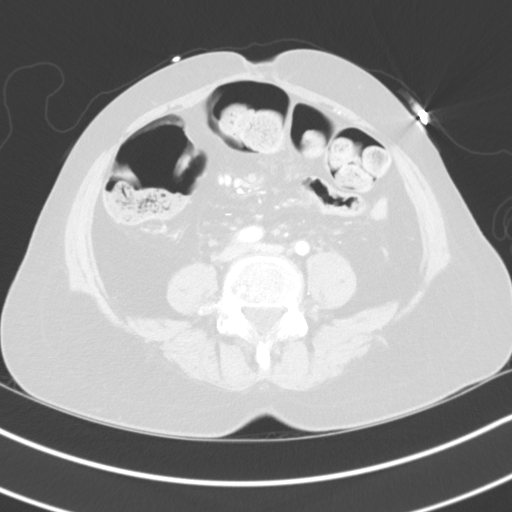
[im 52/115  lung]
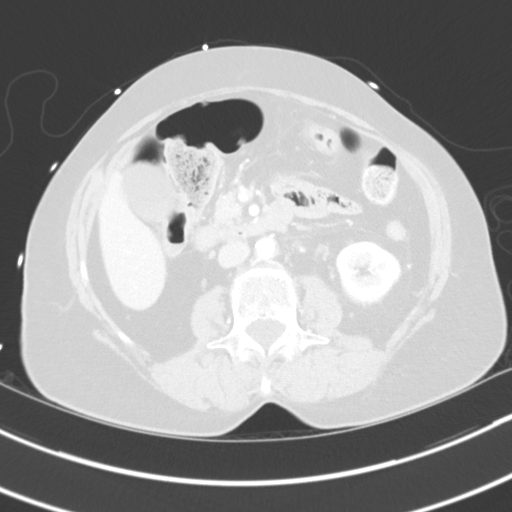
[im 63/115  mediastinal]
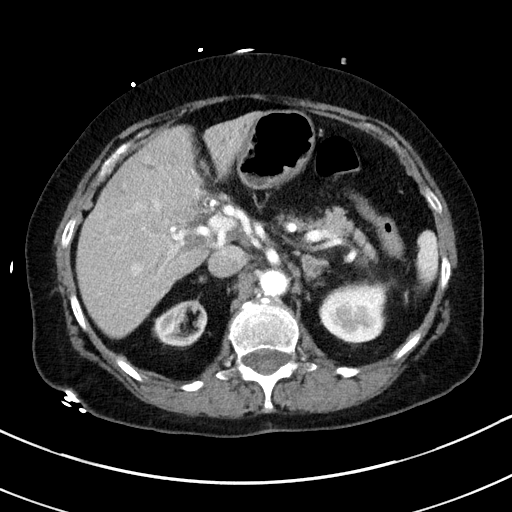
[im 63/115  lung]
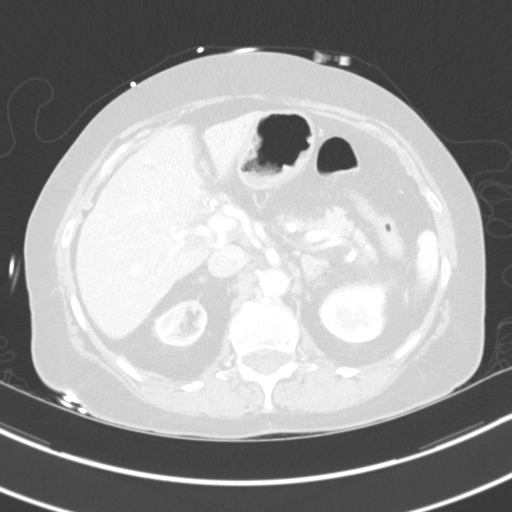
[im 73/115  lung]
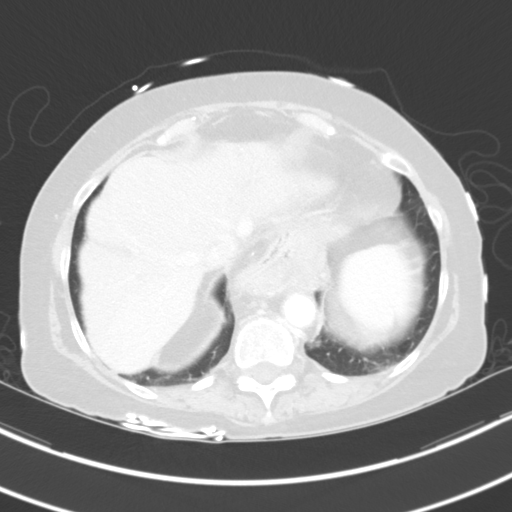
[im 94/115  lung]
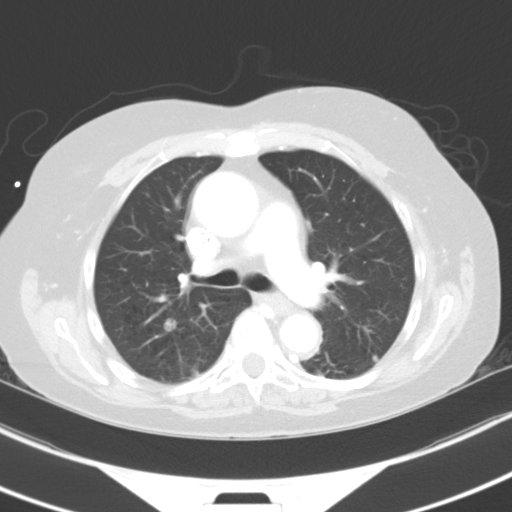
[im 104/115  lung]
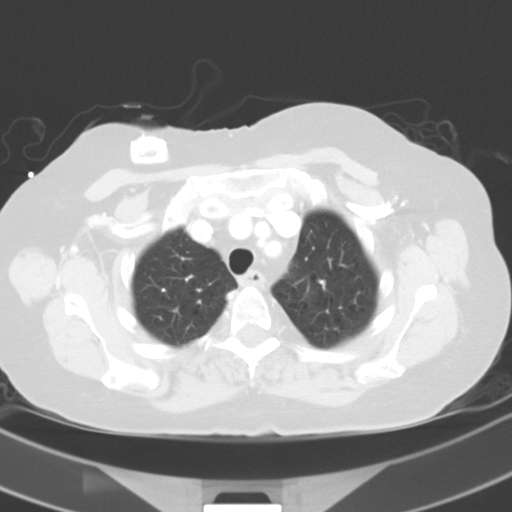

[Series 7: coronals · coronal · 0.59mm/px · 3 of 114 slices shown]
[im 23/114  lung]
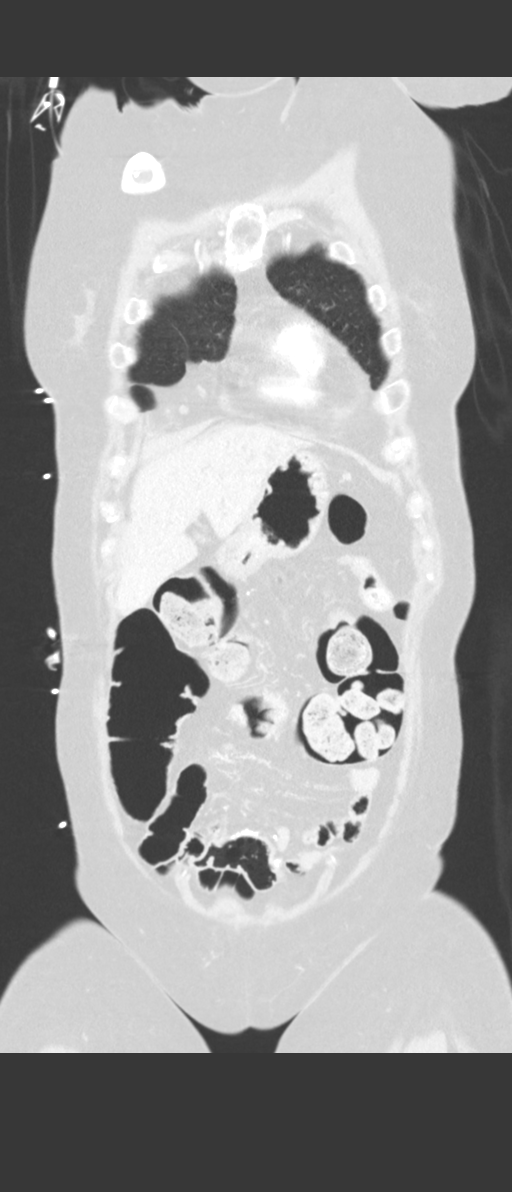
[im 46/114  lung]
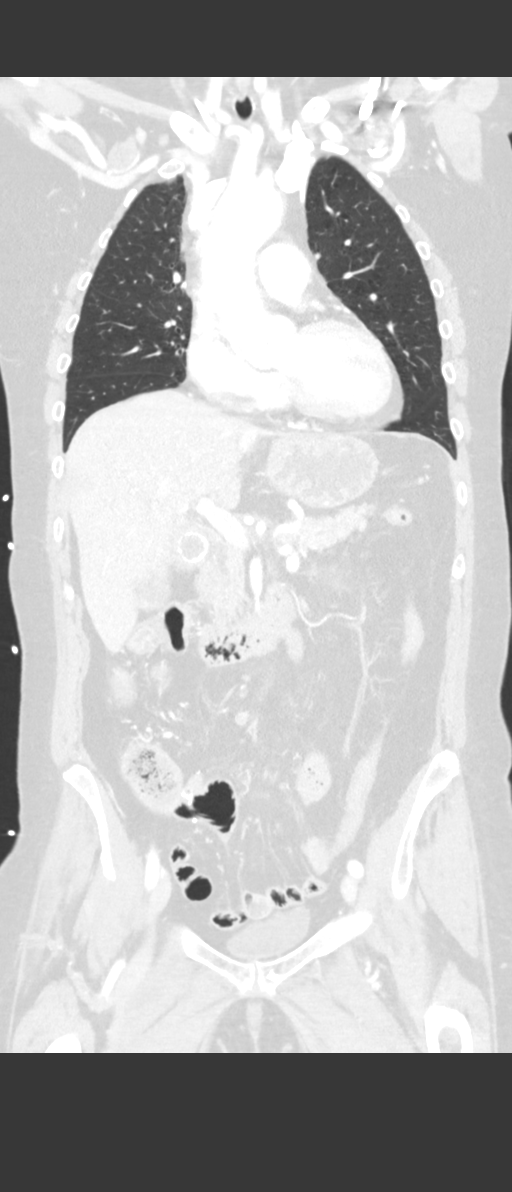
[im 68/114  lung]
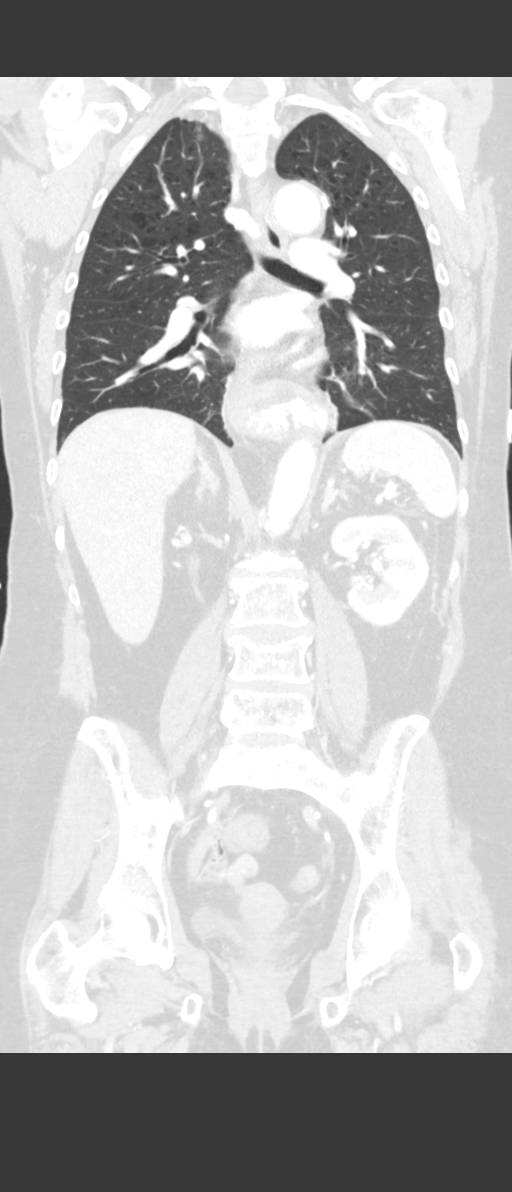

[11 of 36 positions shown; findings below may reference images not displayed]

FINDINGS: CT CHEST FINDINGS

Cardiovascular: Right Port-A-Cath tip:  SVC.

Ascending thoracic aorta:  4.2 cm diameter compatible with aneurysm.

Mild coronary artery atherosclerotic calcification.

Mediastinum/Nodes: Moderate-sized hiatal hernia.

Lungs/Pleura: 12 pulmonary nodules are observed scattered in both
lungs. Index left lower lobe nodule measures 1.2 by 1.1 cm on image
87/6.

Centrilobular emphysema. Mild atelectasis or scarring in both lower
lobes. No pleural effusion.

Musculoskeletal: 65% compression fracture of the T12 vertebral body
with associated sclerosis and 8 mm of posterior bony retropulsion
leading to at least mild central narrowing of the thecal sac. I
favor a benign etiology.

CT ABDOMEN PELVIS FINDINGS

Hepatobiliary: New intrahepatic biliary dilatation in segment 2 of
the liver and to a lesser extent in segment 3, equivocal biliary
dilatation in segment 4 but much less striking. There is some
truncation of portal venous branches in the liver suggesting
stenosis new. Strictly speaking, portal vein thrombosis is a
possible cause for the linear lucencies but is considered less
likely despite the truncation of the portal vein. Occult mass in the
liver is raised as a concern.

Multiple gallstones in the gallbladder measure up to 2.8 cm in long
axis. No significant degree of extrahepatic biliary dilatation.

Poor definition of tissue planes around the extrahepatic biliary
system.

Pancreas: Unremarkable

Spleen: Unremarkable

Adrenals/Urinary Tract: 1.3 by 1.6 cm nodule in the left adrenal
gland, technically nonspecific, possibly metastatic. Severely
atrophic right kidney.

Stomach/Bowel: Multiple bowel anastomoses noted. No obstruction
observed.

Vascular/Lymphatic: Aortoiliac atherosclerotic vascular disease.
Right common iliac node 0.8 cm in short axis image 73/4. Generally
the adenopathy shown on the 04/19/2012 exam is improved.

Reproductive: Uterus and ovaries absent.

Other: Scatter mostly small mesenteric lymph nodes. No abnormal
fluid or nodularity along the paracolic gutters or subhepatic space.
No significant tumor caking along the omentum.

Musculoskeletal: Mild superior endplate compressions at L3 and L4,
age indeterminate. Central disc protrusion at L1- 2 extending
cephalad.
IMPRESSION: 1. Scattered bilateral pulmonary nodules suspicious for metastatic
disease.
2. 65% subacute compression fracture of T12 with 8 mm posterior bony
retropulsion leading to at least mild central narrowing of the
thecal sac
3. Abnormal new intrahepatic biliary dilatation in the lateral
segment left hepatic lobe and to a lesser extent in the medial
segment, with truncation of portal venous branches in the left
hepatic lobe. I am suspicious for potential tumor in the liver
although any obstructing tumor is very ill-defined would probably be
better characterized by a hepatic protocol MRI. There is also a
indistinct soft tissue density along the porta hepatis tracking
along the hepatic artery and extrahepatic biliary tree, and
infiltrating tumor in this vicinity is not excluded. Multiple
gallstones are present in the gallbladder.
4. 1.3 by 1.6 cm nodule in the left adrenal gland, not well seen
previously and possibly metastatic.
5. Mild superior endplate compressions at L3 and L4, age
indeterminate. Central disc protrusion at L1- 2 extending cephalad.
6. **An incidental finding of potential clinical significance has
been found. 4.2 cm in diameter ascending thoracic aortic aneurysm.
Recommend annual imaging followup by CTA or MRA. This recommendation
follows 0979 ACCF/AHA/AATS/ACR/ASA/SCA/JONGGUG/SEPHORA/BAUSCH/ZULLO Guidelines
for the Diagnosis and Management of Patients with Thoracic Aortic
Disease. Circulation. 0979; 121: [id number]**
7. Other imaging findings of potential clinical significance:
Coronary artery atherosclerotic calcification. Moderate-sized hiatal
hernia. Centrilobular emphysema. Multiple bowel anastomoses.
Aortoiliac atherosclerotic vascular disease.
